# Patient Record
Sex: Male | Born: 1965 | Race: White | Hispanic: No | Marital: Single | Smoking: Former smoker
Health system: Southern US, Community
[De-identification: ages and names within clinical notes are randomized; demographics above are authoritative.]

## PROBLEM LIST (undated history)

## (undated) DIAGNOSIS — I10 Essential (primary) hypertension: Secondary | ICD-10-CM

## (undated) DIAGNOSIS — T7840XA Allergy, unspecified, initial encounter: Secondary | ICD-10-CM

## (undated) DIAGNOSIS — E785 Hyperlipidemia, unspecified: Secondary | ICD-10-CM

## (undated) HISTORY — PX: KIDNEY STONE SURGERY: SHX686

## (undated) HISTORY — DX: Allergy, unspecified, initial encounter: T78.40XA

## (undated) HISTORY — DX: Hyperlipidemia, unspecified: E78.5

## (undated) HISTORY — DX: Essential (primary) hypertension: I10

## (undated) HISTORY — PX: VASECTOMY: SHX75

---

## 1998-01-17 ENCOUNTER — Encounter: Admission: RE | Admit: 1998-01-17 | Discharge: 1998-04-17 | Payer: Self-pay | Admitting: Specialist

## 1998-07-27 ENCOUNTER — Encounter: Admission: RE | Admit: 1998-07-27 | Discharge: 1998-10-25 | Payer: Self-pay | Admitting: Specialist

## 1998-09-14 ENCOUNTER — Ambulatory Visit (HOSPITAL_COMMUNITY): Admission: RE | Admit: 1998-09-14 | Discharge: 1998-09-14 | Payer: Self-pay | Admitting: Specialist

## 1998-09-14 ENCOUNTER — Encounter: Payer: Self-pay | Admitting: Specialist

## 2002-12-03 ENCOUNTER — Encounter: Payer: Self-pay | Admitting: Family Medicine

## 2002-12-03 ENCOUNTER — Ambulatory Visit (HOSPITAL_COMMUNITY): Admission: RE | Admit: 2002-12-03 | Discharge: 2002-12-03 | Payer: Self-pay | Admitting: Family Medicine

## 2007-06-06 ENCOUNTER — Ambulatory Visit: Payer: Self-pay | Admitting: Cardiology

## 2007-06-17 ENCOUNTER — Ambulatory Visit: Payer: Self-pay

## 2007-06-20 ENCOUNTER — Ambulatory Visit: Payer: Self-pay | Admitting: Cardiology

## 2007-06-20 LAB — CONVERTED CEMR LAB
BUN: 13 mg/dL (ref 6–23)
Basophils Absolute: 0 10*3/uL (ref 0.0–0.1)
Basophils Relative: 0.5 % (ref 0.0–1.0)
CO2: 30 meq/L (ref 19–32)
Calcium: 9.3 mg/dL (ref 8.4–10.5)
Chloride: 109 meq/L (ref 96–112)
Creatinine, Ser: 0.9 mg/dL (ref 0.4–1.5)
Eosinophils Absolute: 0.1 10*3/uL (ref 0.0–0.6)
Eosinophils Relative: 1.5 % (ref 0.0–5.0)
GFR calc Af Amer: 120 mL/min
GFR calc non Af Amer: 99 mL/min
Glucose, Bld: 109 mg/dL — ABNORMAL HIGH (ref 70–99)
HCT: 36.4 % — ABNORMAL LOW (ref 39.0–52.0)
Hemoglobin: 12.4 g/dL — ABNORMAL LOW (ref 13.0–17.0)
INR: 0.9 (ref 0.8–1.0)
Lymphocytes Relative: 29.6 % (ref 12.0–46.0)
MCHC: 34.2 g/dL (ref 30.0–36.0)
MCV: 90.3 fL (ref 78.0–100.0)
Monocytes Absolute: 0.5 10*3/uL (ref 0.2–0.7)
Monocytes Relative: 7.2 % (ref 3.0–11.0)
Neutro Abs: 4.5 10*3/uL (ref 1.4–7.7)
Neutrophils Relative %: 61.2 % (ref 43.0–77.0)
Platelets: 316 10*3/uL (ref 150–400)
Potassium: 3.5 meq/L (ref 3.5–5.1)
Prothrombin Time: 11.5 s (ref 10.9–13.3)
RBC: 4.03 M/uL — ABNORMAL LOW (ref 4.22–5.81)
RDW: 12.5 % (ref 11.5–14.6)
Sodium: 145 meq/L (ref 135–145)
WBC: 7.3 10*3/uL (ref 4.5–10.5)
aPTT: 26.4 s (ref 21.7–29.8)

## 2007-06-26 ENCOUNTER — Ambulatory Visit: Payer: Self-pay | Admitting: Cardiovascular Disease

## 2007-06-26 ENCOUNTER — Inpatient Hospital Stay (HOSPITAL_BASED_OUTPATIENT_CLINIC_OR_DEPARTMENT_OTHER): Admission: RE | Admit: 2007-06-26 | Discharge: 2007-06-26 | Payer: Self-pay | Admitting: Cardiovascular Disease

## 2007-07-07 ENCOUNTER — Ambulatory Visit: Payer: Self-pay | Admitting: Cardiology

## 2008-06-08 ENCOUNTER — Ambulatory Visit: Payer: Self-pay | Admitting: Cardiology

## 2009-05-06 ENCOUNTER — Encounter (INDEPENDENT_AMBULATORY_CARE_PROVIDER_SITE_OTHER): Payer: Self-pay | Admitting: *Deleted

## 2009-05-27 ENCOUNTER — Encounter: Payer: Self-pay | Admitting: Cardiology

## 2009-06-09 DIAGNOSIS — E785 Hyperlipidemia, unspecified: Secondary | ICD-10-CM

## 2009-06-09 DIAGNOSIS — I10 Essential (primary) hypertension: Secondary | ICD-10-CM | POA: Insufficient documentation

## 2009-06-14 ENCOUNTER — Ambulatory Visit: Payer: Self-pay | Admitting: Cardiology

## 2009-12-06 ENCOUNTER — Ambulatory Visit: Payer: Self-pay | Admitting: Cardiology

## 2009-12-06 DIAGNOSIS — I2510 Atherosclerotic heart disease of native coronary artery without angina pectoris: Secondary | ICD-10-CM | POA: Insufficient documentation

## 2010-11-09 NOTE — Assessment & Plan Note (Signed)
Summary: 6 MO F/U /CY   Visit Type:  6 mo f/u Primary Provider:  Dr. 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  He's having no symptoms of ischemia or angina. He is now on Crestor 10 mg per day but has not had followup blood work. He is scheduled had this done soon with Dr. Lysbeth Galas  He denies orthopnea, PND or peripheral edema.  Current Medications (verified): 1)  Singulair 10 Mg Tabs (Montelukast Sodium) .Marland Kitchen.. 1 Tab Once Daily 2)  Cozaar 50 Mg Tabs (Losartan Potassium) .Marland Kitchen.. 1 Tab Once Daily 3)  Fexofenadine Hcl 180 Mg Tabs (Fexofenadine Hcl) .Marland Kitchen.. 1 Tab Once Daily 4)  Crestor 10 Mg Tabs (Rosuvastatin Calcium) .Marland Kitchen.. 1 Tab At Bedtime 5)  Symbicort 160-4.5 Mcg/act Aero (Budesonide-Formoterol Fumarate) .... 2 Puffs Two Times A Day 6)  Aspirin 81 Mg Tbec (Aspirin) .... Take One Tablet By Mouth Daily 7)  Proair Hfa 108 (90 Base) Mcg/act Aers (Albuterol Sulfate) .... As Needed 8)  Tylenol 325 Mg Tabs (Acetaminophen) .... As Needed 9)  Arthrotec 75 75-200 Mg-Mcg Tabs (Diclofenac-Misoprostol) .Marland Kitchen.. 1 Tab Two Times A Day 10)  Fluticasone Propionate 50 Mcg/act Susp (Fluticasone Propionate) .... 2 Sprays Each Nostril Once Daily  Allergies (verified): No Known Drug Allergies  Past History:  Past Medical History: Last updated: 06/09/2009 CAD, UNSPECIFIED SITE (ICD-414.00) CORONARY ARTERY DISEASE, FAMILY HX (ICD-V17.3) HYPERTENSION, UNSPECIFIED (ICD-401.9) HYPERLIPIDEMIA-MIXED (ICD-272.4) TOBACCO USE, QUIT/ 20+ YRS AGO (ICD-V15.82)  Past Surgical History: Last updated: 06/09/2009 wisdom teeth extracted..1987 Vasectomy..1999 Kidney stones..1996  Family History: Last updated: 06/09/2009 Family History of Coronary Artery Disease: Father age 73 had MI and stroke, Uncle in his 30's had MI, Grandfather deceased at 15 .Marland KitchenEnlarged heart  Social History: Last updated: 06/09/2009 Full  Time Married  Tobacco Use - Former. quit 20+yrs ago Alcohol Use - no Regular Exercise - no Drug Use - no  Risk Factors: Exercise: no (06/09/2009)  Risk Factors: Smoking Status: quit (06/09/2009)  Review of Systems       negative history of present illness  Vital Signs:  Patient profile:   45 year old male Height:      71 inches Weight:      215 pounds BMI:     30.09 Pulse rate:   72 / minute Pulse rhythm:   regular BP sitting:   114 / 80  (left arm) Cuff size:   large  Vitals Entered By: [name] [name], CMA (December 06, 2009 4:39 PM)  Physical Exam  General:  obese.   Head:  normocephalic and atraumatic Eyes:  PERRLA/EOM intact; conjunctiva and lids normal. Neck:  Neck supple, no JVD. No masses, thyromegaly or abnormal cervical nodes. Chest [name] [name]:  no deformities or breast masses noted Lungs:  Clear bilaterally to auscultation and percussion. Heart:  Non-displaced PMI, chest non-tender; regular rate and rhythm, S1, S2 without murmurs, rubs or gallops. Carotid upstroke normal, no bruit. Normal abdominal aortic size, no bruits. Femorals normal pulses, no bruits. Pedals normal pulses. No edema, no varicosities. Msk:  Back normal, normal gait. Muscle strength and tone normal. Pulses:  pulses normal in all 4 extremities Extremities:  No clubbing or cyanosis. Neurologic:  Alert and oriented x 3. Skin:  Intact without lesions or rashes. Psych:  Normal affect.   Problems:  Medical Problems Added: 1)  Dx of Cad, Native Vessel  (ICD-414.01)  Impression & Recommendations:  Problem # 1:  CAD, NATIVE VESSEL (ICD-414.01) Assessment Unchanged  His updated medication list for this problem includes:    Aspirin 81 Mg Tbec (Aspirin) .Marland Kitchen... Take one tablet by mouth daily  His updated medication list for this problem includes:    Aspirin 81 Mg Tbec (Aspirin) .Marland Kitchen... Take one tablet by mouth daily  Problem # 2:  CORONARY ARTERY DISEASE, FAMILY HX (ICD-V17.3) Assessment:  Unchanged  Problem # 3:  HYPERTENSION, UNSPECIFIED (ICD-401.9) Assessment: Improved  His updated medication list for this problem includes:    Cozaar 50 Mg Tabs (Losartan potassium) .Marland Kitchen... 1 tab once daily    Aspirin 81 Mg Tbec (Aspirin) .Marland Kitchen... Take one tablet by mouth daily  His updated medication list for this problem includes:    Cozaar 50 Mg Tabs (Losartan potassium) .Marland Kitchen... 1 tab once daily    Aspirin 81 Mg Tbec (Aspirin) .Marland Kitchen... Take one tablet by mouth daily  Problem # 4:  HYPERLIPIDEMIA-MIXED (ICD-272.4) i reviewed his lipid values from October. I would like his total cholesterol B1 50 or less, and LDL 70 or less, and maintain his current HDL and triglyceride levels. I've giving him a copy of his blood work and with the desired numbers listed. He will continue the Crestor have his primary care check these values on next visit. His updated medication list for this problem includes:    Crestor 10 Mg Tabs (Rosuvastatin calcium) .Marland Kitchen... 1 tab at bedtime  His updated medication list for this problem includes:    Crestor 10 Mg Tabs (Rosuvastatin calcium) .Marland Kitchen... 1 tab at bedtime  Patient Instructions: 1)  Your physician recommends that you schedule a follow-up appointment in: year with dr Zniyah Midkiff 2)  Your physician recommends that you continue on your current medications as directed. Please refer to the Current Medication list given to you today.

## 2011-02-20 NOTE — Assessment & Plan Note (Signed)
[name]                            CARDIOLOGY OFFICE NOTE   NAME:[name] [name] LARIN                     MRN:          [id number]  DATE:06/08/2008                            DOB:          1966/09/04    [name] [name] returns today for further management small vessel coronary  disease.  He is having no angina or ischemic symptoms.  He does on  occasion after he eats feels like he has reflux or chest burning.  He is  also taking diclofenac for back problems which is exacerbating this.   His lipids are being followed at [facility].  He  is on simvastatin 40 mg a day.  He is also on aspirin 81 mg a day.  For  his blood pressure which is being well controlled on Cozaar 50 mg a day.   His cath was on June 26, 2007.  This was after a stress test showed  a very small area of apical ischemia.  At the present time, we are  pursuing aggressive medical therapy.   PHYSICAL EXAMINATION:  VITAL SIGNS:  His blood pressure today is 123/84,  his pulse 83 and regular, his weight is 204.  HEENT:  Normal.  NECK:  Carotids upstrokes were equal bilaterally without bruits.  No  JVD.  Thyroid is not enlarged.  Trachea is midline.  LUNGS:  Clear.  HEART:  Regular rate and rhythm, nondisplaced PMI.  ABDOMEN:  Protuberant, good bowel sounds.  No midline bruits.  EXTREMITIES:  No cyanosis, clubbing, or edema.  Pulses are intact.  NEURO:  Intact.   EKG is normal.   ASSESSMENT AND PLAN:  [name] [name] is doing well at the present time.  I  have told him that the most important thing is keeping his blood  pressure tightly controlled, getting back into a regular exercise  routine, losing some weight, staying with his simvastatin.  We will see  him back in a year.  At that time, we will repeat exercise stress test  Myoview to make sure he is still at low risk.     Thomas C. Daleen Squibb, MD, [name]  Electronically Signed    TCW/MedQ  DD: 06/08/2008  DT:  06/09/2008  Job #: [id number]   cc:   Ernestina Penna, M.D.

## 2011-02-20 NOTE — Assessment & Plan Note (Signed)
[facility]                            CARDIOLOGY OFFICE NOTE   NAME:[name], [name] [name]                     MRN:          [id number]  DATE:06/06/2007                            DOB:          [birth year]    Dr. Christell Constant asked [place] to consult on [name] [name] for chest  discomfort.   HISTORY OF PRESENT ILLNESS:  He is 45 years of age, married, has 2  children.  We saw him and performed his treadmill January 04, 2004.  This  was negative adequate.  This was for some atypical chest pain.   About 2 weeks ago, he woke up, and he had some constriction in his  chest.  He was having a little bit of trouble breathing.  He denied any  wheezing or cough.  He went to the physician, and they referred him  here.   He has multiple cardiac risk factors including male sex, family history  of coronary disease in his father having his first MI at age 25, remote  tobacco which he quit 20 years ago, hypertension, hyperlipidemia.   PAST MEDICAL HISTORY:  1. He has no known dye allergies.  2. He has no allergies to medications in general.   CURRENT MEDICATIONS:  1. Singulair 10 mg daily.  2. Fluticasone nasal spray.  3. Astelin nasal spray.  4. Diclofenac 50 mg daily.  5. Cozaar 50 mg daily.  6. Fexofenadine 180 mg daily.  7. Simvastatin 40 mg daily.  8. Advair inhaler 500/50, 1 puff b.i.d.  9. Flonase 50 mcg daily.  10.Prilosec 20 mg daily.  11.ProAir p.r.n.   He has a history of bronchial constrictive disease which he really does  not say is asthma.  He says all these things have helped.  His symptoms  were mostly paroxysms of cough that were uncontrollable.  He says all  these medicines have helped him.   SURGICAL HISTORY:  1. He has had wisdom teeth removed.  2. Vasectomy.  3. Kidney stones.   FAMILY HISTORY:  Remarkable for his dad, as mentioned above.   SOCIAL HISTORY:  He is married and has 2 children.  He manages  production and operation.   REVIEW OF SYSTEMS:  Other than the HPI, he has a history of chronic  fatigue and some chronic arthritis.   EXAM:  Very pleasant.  Blood pressure is 110/90.  His pulse 71 and regular.  He is 5 feet 10  inches and weighs 202.  HEENT:  Normocephalic, atraumatic.  He wears glasses.  PERRLA.  Extraocular movements intact.  Sclerae are clear.  He has a mustache.  Facial symmetry is normal.  Carotid upstrokes are equal bilaterally without bruits.  No JVD.  Thyroid is not enlarged.  Trachea is midline.  LUNGS:  Clear without wheezes or rhonchi.  HEART:  Nondisplaced PMI.  He has a normal S1 and S2 without murmur or  gallop.  ABDOMEN:  Soft, good bowel sounds, no midline bruit.  EXTREMITIES:  No edema.  Pulses are intact.  No sign of DVT.  NEUROLOGIC:  Intact.   Electrocardiogram is completely  normal.   ASSESSMENT:  Chest constriction without obvious bronchospasm.  With his  cardiac risk factors, we need to rule out obstructive coronary disease.   PLAN:  1. Exercise rest test, Myoview.  2. Continue risk factor modification and the program that he is on.  3. Start Aspirin 81 mg daily.     Thomas C. Daleen Squibb, MD, [facility]  Electronically Signed    TCW/MedQ  DD: 06/06/2007  DT: 06/08/2007  Job #: [id number]   cc:   Ernestina Penna, M.D.

## 2011-02-20 NOTE — Assessment & Plan Note (Signed)
[facility]                            CARDIOLOGY OFFICE NOTE   NAME:[name] [name], [name] [name]                     MRN:          [id number]  DATE:07/07/2007                            DOB:          09/24/1966    The patient returns today after having cardiac catheterization for  cardiac risk factors and a very small area of ischemia at the apex on a  stress Myoview.  EF 66%.   His cardiac catheterization showed normal epicardial vessels, but small  distal disease with a distal portion of his LAD and apical portion of  LAD showing a 95% stenosis.  He had a total occlusion of a small obtuse  marginal branch and a very small nondominant right coronary artery.  He  has normal left ventricular function.   His wife comes with him today who is highly motivated to get him to take  good care of himself.  He has been started on Simvastatin by his primary  care physician with follow-up blood work scheduled in about six weeks,  he is on Cozaar for his blood pressure which has been under good control  and aspirin 81 mg a day.   PHYSICAL EXAMINATION:  VITAL SIGNS:  His blood pressure today is 129/90,  pulse 70 and regular, weight 206.  HEENT:  Unchanged.  Carotid upstrokes are equal bilaterally without  bruits.  No JVD.  Thyroid is not enlarged.  Trachea is midline.  LUNGS:  Clear.  HEART:  Regular rate and rhythm without gallop.  ABDOMEN:  Good bowel sounds.  EXTREMITIES:  No edema.  Pulses are intact.  NEUROLOGY:  Intact.   I had a lot talk with the patient and his wife.  We have recommended  several web sites for low cholesterol, low saturated fat, and weight  reduction diets.  He will continue his current medications.  He will  follow up with his primary care physician, Ernestina Penna, M.D. for  lipids.  I will see him back on an annual basis.     Thomas C. Daleen Squibb, MD, [facility]  Electronically Signed    TCW/MedQ  DD: 07/07/2007  DT: 07/07/2007  Job #:  [id number]   cc:   Ernestina Penna, M.D.

## 2011-02-20 NOTE — Assessment & Plan Note (Signed)
[name]                            CARDIOLOGY OFFICE NOTE   [name], [name]                     MRN:          [id number]  DATE:06/20/2007                            DOB:          10-11-1965    HISTORY OF PRESENT ILLNESS:  [name] is a 45 year old married  white male, father of 2, has a real bad family history of coronary  artery disease. Father had his first myocardial infarction at age 74.   His other risk factors are hypertension, hyperlipidemia, and remote  tobacco which he quit 20 years ago.   He saw me in the office on June 06, 2007. He had had an episode of  waking up with a constricted chest pain. He has had several episodes  since then which are random. He does have a history of bronchial  problems and he thought that was what it was.   We did a stress Myoview on June 17, 2007. He exercised for 8 minutes  and 30 seconds, met level was 10.1. It was an adequate study. There were  no EKG changes. He did not have any chest pain.   His scan showed an ejection fraction of 66% with a question of apical  ischemia. He did have shoulder pain in retrospect with his stress test.   PAST MEDICAL HISTORY:  He has no dye allergy and no allergies to  medications.   CURRENT MEDICATIONS:  1. Singulair 10 mg a day.  2. Fluticasone nasal spray.  3. Astelin nasal spray.  4. Diclofenac 50 mg a day.  5. Cozaar 50 mg a day.  6. Fexofenadine 180 mg a day.  7. Simvastatin 40 mg a day.  8. Advair inhaler 500/50 one puff b.i.d.  9. Flonase 50 mcg a day.  10.Prilosec 20 mg a day.  11.ProAir.   SURGICAL HISTORY:  He has had his wisdom teeth extracted, vasectomy, and  kidney stones.   FAMILY HISTORY:  As above.   SOCIAL HISTORY:  He is married and has 2 children. He manages production  operation.   REVIEW OF SYSTEMS:  Other than his HPI are negative except for chronic  fatigue and chronic arthritis.   PHYSICAL EXAMINATION:  He  is extremely pleasant. His blood pressure is  128/84, his pulse 78 and regular, his weight is 207.  HEENT: Normocephalic, atraumatic, wears glasses. PERRLA: Extraocular  movements intact. Sclera clear. Facial symmetry is normal. Carotid  upstrokes are equal bilaterally without bruits. Thyroid is not enlarged.  Trachea is midline.  LUNGS: Clear without wheezes or rhonchi.  HEART: Non displaced PMI. Normal S1, S2 without murmur, rub, or gallop.  ABDOMINAL EXAM: Soft, good bowel sounds. No midline bruits.  EXTREMITIES: No cyanosis, clubbing, or edema. Pulses are intact. No sign  of DVT.  NEUROLOGICAL: Intact.   Electrocardiogram is normal.   ASSESSMENT:  Chest constriction with question of apical ischemia on a  stress Myoview. He has multiple risk factors including positive family  history.   PLAN:  Outpatient JV catheterization. Indications, risks, potential  benefits have been discussed. He agrees to proceed. We will  try to  obtain this next week.     Thomas C. Daleen Squibb, MD, [facility]  Electronically Signed    TCW/MedQ  DD: 06/20/2007  DT: 06/20/2007  Job #: [id number]   cc:   Ernestina Penna, M.D.

## 2011-02-20 NOTE — Cardiovascular Report (Signed)
NAMECRISTO, [name] NO.:  [id number]   MEDICAL RECORD NO.:  [id number]          PATIENT TYPE:  OIB   LOCATION:  1963                         FACILITY:  [facility]   PHYSICIAN:  Veverly Fells. Excell Seltzer, MD  DATE OF BIRTH:  Aug 15, 1966   DATE OF PROCEDURE:  DATE OF DISCHARGE:                            CARDIAC CATHETERIZATION   PROCEDURE:  Left heart catheterization, selective coronary angiography,  left ventricular angiography.   INDICATIONS:  [name] [name] is a very nice 45 year old gentleman with  multiple cardiac risk factors including a very strong family history of  CAD.  He has had intermittent chest tightness and underwent a stress  Myoview study that demonstrated a small area of apical ischemia.  He was  referred for cardiac catheterization for further evaluation.   Risks and indications of the procedure were reviewed with the patient.  Informed consent was obtained.  The right groin was prepped, draped and  anesthetized with 1% lidocaine.  A 4-French sheath was placed in the  right femoral artery using modified Seldinger technique.  Standard 4-  French catheters were used to image the coronary arteries.  Following  selective coronary angiography an angled pigtail catheter was placed in  the left ventricle where pressures were recorded.  The left  ventriculogram was performed.  A pullback across the aortic valve was  done.  All catheter exchanges were performed over a guidewire.  There  were no immediate complications.   FINDINGS:  Aortic pressure 114/78 with a mean of 97, left ventricular  pressure 114/16.   CORONARY ANGIOGRAPHY:  The left mainstem is angiographically normal.  It  bifurcates into the LAD and left circumflex.   The LAD is a large-caliber vessel through its proximal and midportions.  It tapers into a smaller vessel in the mid and distal region.  Proximally,  there are minor nonobstructive stenoses of 40% before the  first perforator and 30% in the  midportion of the vessel.  There is a  medium-sized first diagonal branch.  There is diffuse minor disease  throughout the distal LAD and in the very apical portion of the LAD.  In  the small portion of the vessel there is a 95% stenosis.   Left circumflex is large-caliber.  It is a dominant circumflex.  There  is a small intermediate branch that has no significant angiographic  stenosis.  The first OM is very small.  There are minor luminal  irregularities throughout the proximal portion of the circumflex.  The  second OM branch is medium size and has mild nonobstructive disease.  The third OM is small and has an 80% focal stenosis.  The left PDA has a  40-50% stenosis and there are two posterolateral branches that have no  significant angiographic stenosis.   The right coronary artery is a small nondominant vessel.  It is occluded  in its midportion.  There is diffuse disease throughout the proximal  portion.  The distal right coronary artery fills from left-to-right  collaterals.   Left ventricular function assessed with left ventriculography is normal.  The LVEF is 65%.   ASSESSMENT:  1. Diffuse small vessel coronary artery disease affecting the apical      portion of the LAD and a small OM branch with total occlusion of a      small nondominant right coronary artery.  2. Normal left ventricular systolic function.   RECOMMENDATIONS:  [name] [name] has small vessel CAD.  His major epicardial  vessels have nonobstructive disease.  Would recommend aggressive medical  therapy.  Will arrange a follow-up visit with Dr. Daleen Squibb to guide his  medical therapy.      Veverly Fells. Excell Seltzer, MD  Electronically Signed     MDC/MEDQ  D:  06/26/2007  T:  06/26/2007  Job:  [id number]   cc:   Thomas C. Daleen Squibb, MD, [facility]  Ernestina Penna, M.D.

## 2013-03-04 ENCOUNTER — Ambulatory Visit (INDEPENDENT_AMBULATORY_CARE_PROVIDER_SITE_OTHER): Payer: PRIVATE HEALTH INSURANCE | Admitting: General Practice

## 2013-03-04 ENCOUNTER — Telehealth: Payer: Self-pay | Admitting: Nurse Practitioner

## 2013-03-04 ENCOUNTER — Encounter: Payer: Self-pay | Admitting: General Practice

## 2013-03-04 VITALS — BP 115/78 | HR 75 | Temp 97.4°F | Ht 69.0 in | Wt 212.0 lb

## 2013-03-04 DIAGNOSIS — M5380 Other specified dorsopathies, site unspecified: Secondary | ICD-10-CM

## 2013-03-04 DIAGNOSIS — M62830 Muscle spasm of back: Secondary | ICD-10-CM

## 2013-03-04 MED ORDER — CYCLOBENZAPRINE HCL 5 MG PO TABS: 5.0000 mg | ORAL_TABLET | Freq: Three times a day (TID) | ORAL | Status: DC | PRN

## 2013-03-04 NOTE — Telephone Encounter (Signed)
appt given for 9:00 with [name] [name]

## 2013-03-04 NOTE — Progress Notes (Signed)
  Subjective:    Patient ID: [name] [name], male    DOB: 04/18/1966, 47 y.o.   MRN: [id number]  Back Pain This is a chronic problem. The current episode started in the past 7 days. The problem occurs 2 to 4 times per day. The problem has been gradually worsening since onset. The pain is present in the lumbar spine. The quality of the pain is described as cramping and aching. The pain radiates to the right thigh. The pain is at a severity of 5/10. The pain is mild. The pain is worse during the night. The symptoms are aggravated by lying down. Pertinent negatives include no abdominal pain, bladder incontinence, bowel incontinence, chest pain, fever, headaches or numbness. Risk factors include lack of exercise. He has tried NSAIDs (Taking 800mg  ibuprofen at night. ) for the symptoms. The treatment provided no relief.  Reports seeing a chiropractor and ortho specialist in the past. Reports massages have relieved some of the pain in the past. Hasn't seen any specialist recently.     Review of Systems  Constitutional: Negative for fever and chills.  HENT: Negative for neck pain.   Respiratory: Negative for cough and chest tightness.   Cardiovascular: Negative for chest pain and palpitations.  Gastrointestinal: Negative for abdominal pain and bowel incontinence.  Genitourinary: Negative for bladder incontinence and difficulty urinating.  Musculoskeletal: Positive for back pain.  Skin: Negative.   Neurological: Negative for dizziness, numbness and headaches.  Psychiatric/Behavioral: Negative.        Objective:   Physical Exam  Constitutional: He is oriented to person, place, and time. He appears well-developed and well-nourished.  Cardiovascular: Normal rate, regular rhythm and normal heart sounds.   Pulmonary/Chest: Effort normal and breath sounds normal. No respiratory distress. He exhibits no tenderness.  Musculoskeletal:  Muscle tension noted to right lumbar area  Neurological: He is  alert and oriented to person, place, and time.  Skin: Skin is warm and dry.  Psychiatric: He has a normal mood and affect.          Assessment & Plan:  1. Spasm of muscle, back - cyclobenzaprine (FLEXERIL) 5 MG tablet; Take 1 tablet (5 mg total) by mouth 3 (three) times daily as needed for muscle spasms.  Dispense: 30 tablet; Refill: 0 -apply heat to affected area for 10 minutes, three times a daily -rest -RTO if symptoms worsen -sedation precautions -Will consider ortho or neuro if unresolved Patient verbalized understanding Coralie Keens, FNP-C

## 2014-08-26 ENCOUNTER — Telehealth: Payer: Self-pay | Admitting: Family Medicine

## 2014-08-26 ENCOUNTER — Ambulatory Visit (INDEPENDENT_AMBULATORY_CARE_PROVIDER_SITE_OTHER): Payer: PRIVATE HEALTH INSURANCE

## 2014-08-26 ENCOUNTER — Encounter: Payer: Self-pay | Admitting: Family Medicine

## 2014-08-26 ENCOUNTER — Ambulatory Visit (INDEPENDENT_AMBULATORY_CARE_PROVIDER_SITE_OTHER): Payer: PRIVATE HEALTH INSURANCE | Admitting: Family Medicine

## 2014-08-26 VITALS — BP 121/83 | HR 89 | Temp 97.4°F | Ht 69.0 in | Wt 221.0 lb

## 2014-08-26 DIAGNOSIS — M79672 Pain in left foot: Secondary | ICD-10-CM

## 2014-08-26 DIAGNOSIS — S93402A Sprain of unspecified ligament of left ankle, initial encounter: Secondary | ICD-10-CM

## 2014-08-26 DIAGNOSIS — M25572 Pain in left ankle and joints of left foot: Secondary | ICD-10-CM

## 2014-08-26 MED ORDER — MELOXICAM 7.5 MG PO TABS: 7.5000 mg | ORAL_TABLET | Freq: Every day | ORAL | Status: DC

## 2014-08-26 NOTE — Patient Instructions (Addendum)
Use Ace bandage Take medication as directed Use warm wet compresses with range of motion exercises We will call you with the official radiological reading once that reading is available

## 2014-08-26 NOTE — Telephone Encounter (Signed)
Appointment given for 2:45 with Christell ConstantMoore

## 2014-08-26 NOTE — Progress Notes (Signed)
Subjective:    Patient ID: [name] BolognaGregory A Henrikson, male    DOB: 07-22-1966, 48 y.o.   MRN: [id number]  HPI Patient here for left foot and ankle pain. He twisted it when he stepped in a hole in the yard 2 weeks ago.        Patient Active Problem List   Diagnosis Date Noted  . CAD, NATIVE VESSEL 12/06/2009  . HYPERLIPIDEMIA-MIXED 06/09/2009  . HYPERTENSION, UNSPECIFIED 06/09/2009   Outpatient Encounter Prescriptions as of 08/26/2014  Medication Sig  . aspirin 81 MG tablet Take 81 mg by mouth daily.  . budesonide-formoterol (SYMBICORT) 160-4.5 MCG/ACT inhaler Inhale 2 puffs into the lungs 2 (two) times daily.  . fexofenadine (ALLEGRA) 180 MG tablet Take 180 mg by mouth daily.  . fluticasone (FLONASE) 50 MCG/ACT nasal spray Place 2 sprays into the nose daily.  Marland Kitchen. losartan-hydrochlorothiazide (HYZAAR) 50-12.5 MG per tablet Take 1 tablet by mouth daily.  . montelukast (SINGULAIR) 10 MG tablet Take 10 mg by mouth at bedtime.  . Pitavastatin Calcium (LIVALO) 4 MG TABS Take 4 mg by mouth daily.  . [DISCONTINUED] cyclobenzaprine (FLEXERIL) 5 MG tablet Take 1 tablet (5 mg total) by mouth 3 (three) times daily as needed for muscle spasms.    Review of Systems  Constitutional: Negative.   HENT: Negative.   Eyes: Negative.   Respiratory: Negative.   Cardiovascular: Negative.   Gastrointestinal: Negative.   Endocrine: Negative.   Genitourinary: Negative.   Musculoskeletal: Positive for arthralgias (left foot and ankle pain).  Skin: Negative.   Allergic/Immunologic: Negative.   Neurological: Negative.   Hematological: Negative.   Psychiatric/Behavioral: Negative.        Objective:   Physical Exam  Constitutional: He is oriented to person, place, and time. He appears well-developed and well-nourished. No distress.  HENT:  Head: Normocephalic.  Eyes: Conjunctivae and EOM are normal. Pupils are equal, round, and reactive to light. Right eye exhibits no discharge. Left eye exhibits no  discharge. No scleral icterus.  Neck: Normal range of motion.  Musculoskeletal: Normal range of motion. He exhibits tenderness.  Tender dorsal foot and increased pain with extension of foot. Minimal swelling  Neurological: He is alert and oriented to person, place, and time.  Skin: No rash noted.  Psychiatric: He has a normal mood and affect. His behavior is normal. Judgment and thought content normal.  Vitals reviewed.  BP 121/83 mmHg  Pulse 89  Temp(Src) 97.4 F (36.3 C) (Oral)  Ht 5\' 9"  (1.753 m)  Wt 221 lb (100.245 kg)  BMI 32.62 kg/m2   [facility] reading (PRIMARY) by  Dr. Shawn StallMoore-left foot--no acute findings                                       Assessment & Plan:  1. Left ankle pain - DG Ankle Complete Left; Future - meloxicam (MOBIC) 7.5 MG tablet; Take 1 tablet (7.5 mg total) by mouth daily.  Dispense: 30 tablet; Refill: 0  2. Left foot pain - DG Ankle Complete Left; Future - meloxicam (MOBIC) 7.5 MG tablet; Take 1 tablet (7.5 mg total) by mouth daily.  Dispense: 30 tablet; Refill: 0  3. Sprain of ankle, left, initial encounter - meloxicam (MOBIC) 7.5 MG tablet; Take 1 tablet (7.5 mg total) by mouth daily.  Dispense: 30 tablet; Refill: 0  Patient Instructions  Use Ace bandage Take medication as directed Use warm wet compresses  with range of motion exercises We will call you with the official radiological reading once that reading is available   Nyra Capeson W. Moore MD

## 2014-11-03 ENCOUNTER — Other Ambulatory Visit: Payer: Self-pay | Admitting: Family Medicine

## 2014-12-30 ENCOUNTER — Other Ambulatory Visit: Payer: Self-pay | Admitting: Family Medicine

## 2015-02-02 ENCOUNTER — Other Ambulatory Visit: Payer: Self-pay | Admitting: Family Medicine

## 2015-04-22 IMAGING — CR DG ANKLE COMPLETE 3+V*L*
3 series · 3 of 3 positions shown · non-contrast
Comparison: None.

CLINICAL DATA: Left foot pain, left ankle pain.  Initially counter.

EXAM:
LEFT ANKLE COMPLETE - 3+ VIEW

[view not recorded (1 of 3)]
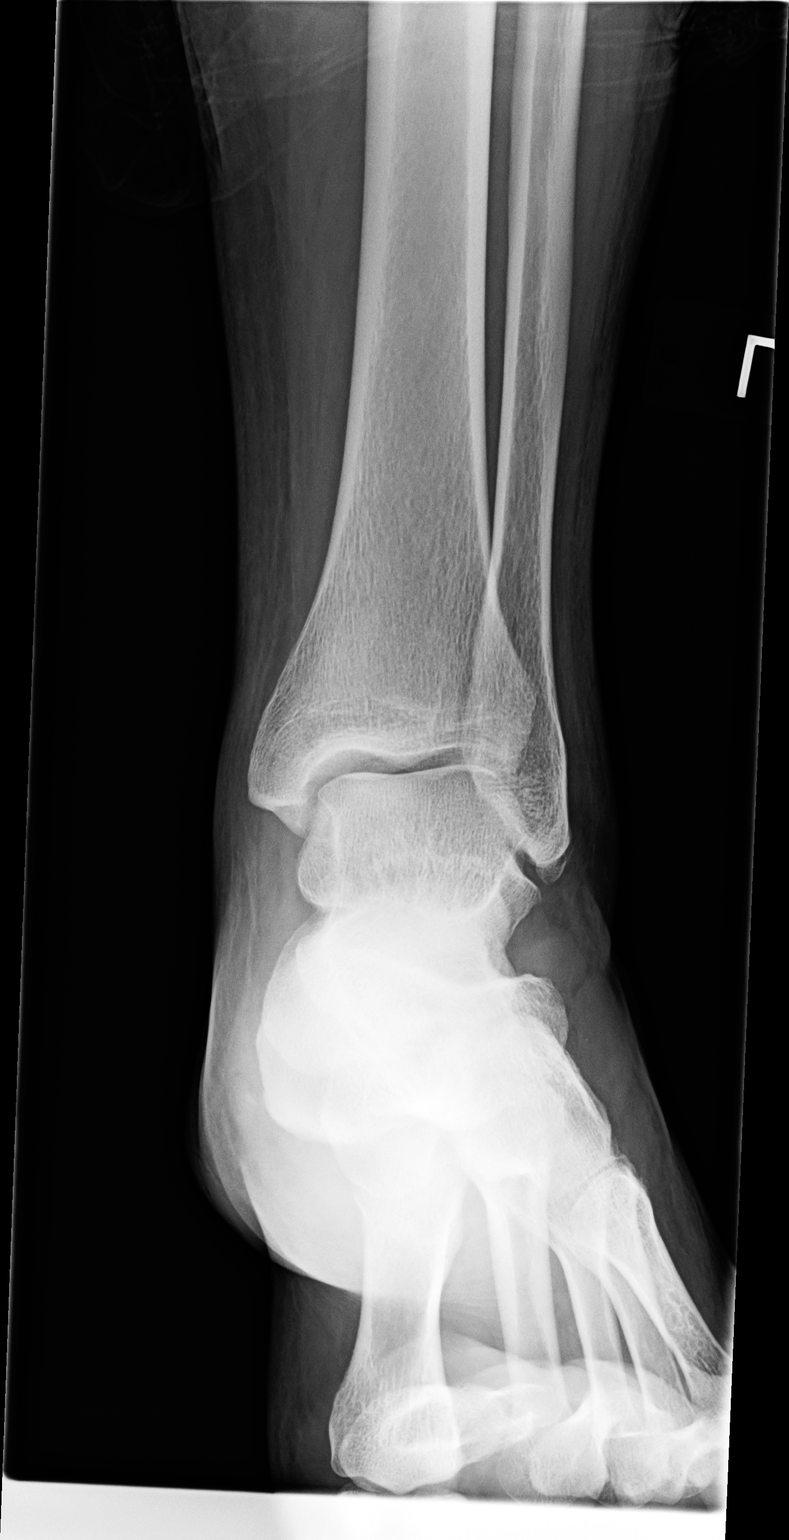

[view not recorded (2 of 3)]
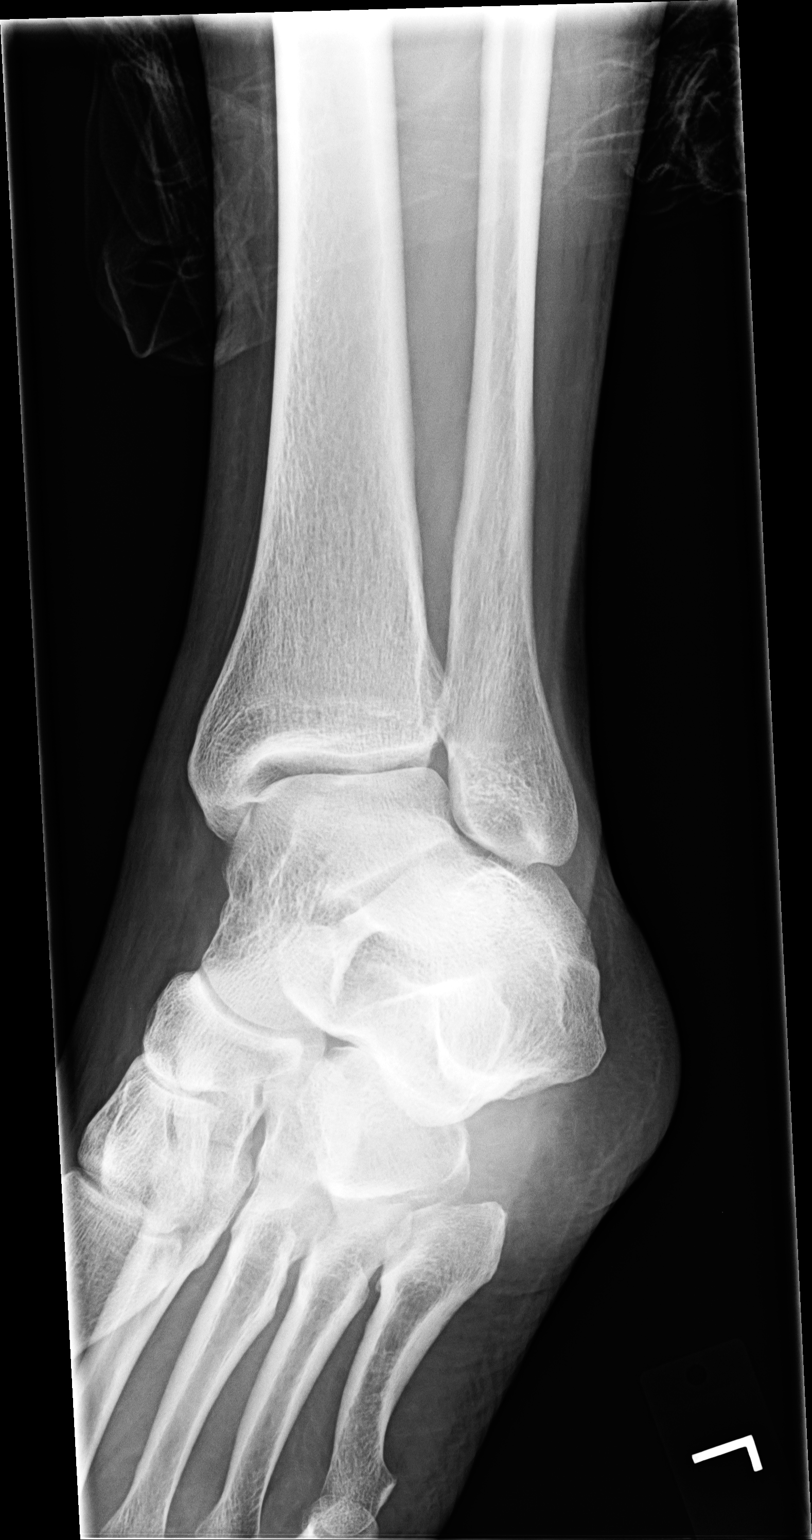

[view not recorded (3 of 3)]
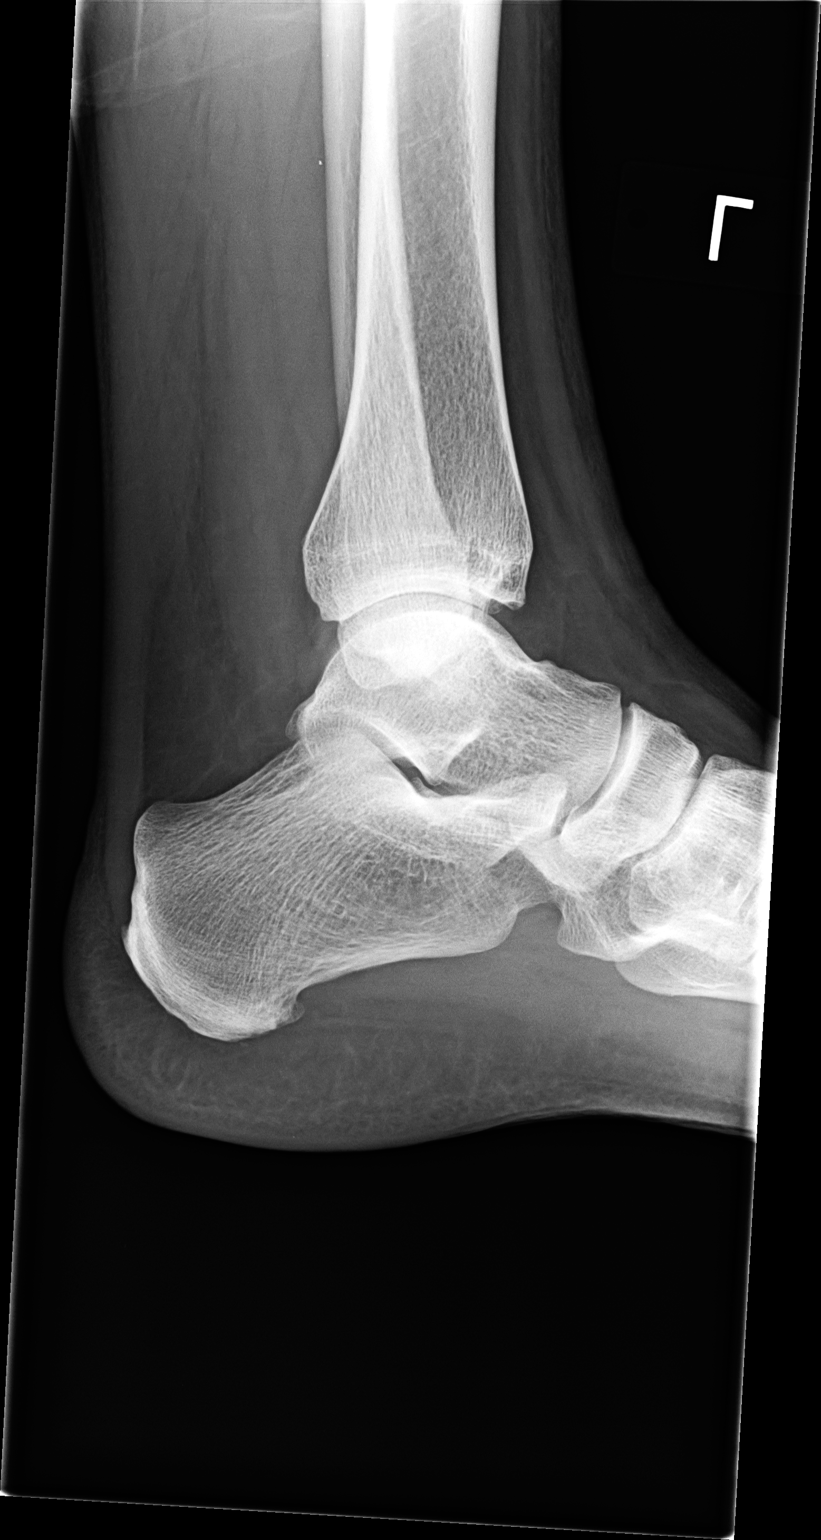

[3 of 3 positions shown; findings below may reference images not displayed]

FINDINGS: There is no evidence of fracture, dislocation, or joint effusion.
There is no evidence of arthropathy or other focal bone abnormality.
Soft tissues are unremarkable.
IMPRESSION: No acute osseous injury of the left ankle.

## 2015-07-20 ENCOUNTER — Ambulatory Visit (INDEPENDENT_AMBULATORY_CARE_PROVIDER_SITE_OTHER): Payer: BLUE CROSS/BLUE SHIELD | Admitting: *Deleted

## 2015-07-20 DIAGNOSIS — Z23 Encounter for immunization: Secondary | ICD-10-CM

## 2016-06-08 DEATH — deceased
